# Patient Record
Sex: Male | Born: 1985 | Race: Black or African American | Hispanic: No | Marital: Single
Health system: Southern US, Community
[De-identification: ages and names within clinical notes are randomized; demographics above are authoritative.]

---

## 2012-08-12 ENCOUNTER — Emergency Department: Payer: Self-pay | Admitting: Emergency Medicine

## 2012-08-18 ENCOUNTER — Ambulatory Visit: Payer: Self-pay | Admitting: Specialist

## 2012-08-20 ENCOUNTER — Ambulatory Visit: Payer: Self-pay | Admitting: Specialist
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
